# Patient Record
Sex: Male | Born: 1995 | Race: Black or African American | Hispanic: No | Marital: Single | Smoking: Former smoker
Health system: Southern US, Community
[De-identification: ages and names within clinical notes are randomized; demographics above are authoritative.]

## PROBLEM LIST (undated history)

## (undated) DIAGNOSIS — F32A Depression, unspecified: Secondary | ICD-10-CM

## (undated) DIAGNOSIS — F419 Anxiety disorder, unspecified: Secondary | ICD-10-CM

---

## 2020-01-03 ENCOUNTER — Emergency Department (HOSPITAL_COMMUNITY): Payer: Self-pay

## 2020-01-03 ENCOUNTER — Emergency Department (HOSPITAL_COMMUNITY)
Admission: EM | Admit: 2020-01-03 | Discharge: 2020-01-06 | Disposition: A | Discharge status: Home / Self Care | Payer: Self-pay | Attending: Emergency Medicine | Admitting: Emergency Medicine

## 2020-01-03 DIAGNOSIS — X789XXA Intentional self-harm by unspecified sharp object, initial encounter: Secondary | ICD-10-CM | POA: Insufficient documentation

## 2020-01-03 DIAGNOSIS — Z046 Encounter for general psychiatric examination, requested by authority: Secondary | ICD-10-CM

## 2020-01-03 DIAGNOSIS — T1490XA Injury, unspecified, initial encounter: Secondary | ICD-10-CM

## 2020-01-03 DIAGNOSIS — F322 Major depressive disorder, single episode, severe without psychotic features: Secondary | ICD-10-CM | POA: Insufficient documentation

## 2020-01-03 DIAGNOSIS — T1491XA Suicide attempt, initial encounter: Secondary | ICD-10-CM

## 2020-01-03 DIAGNOSIS — Z23 Encounter for immunization: Secondary | ICD-10-CM | POA: Insufficient documentation

## 2020-01-03 DIAGNOSIS — S61512A Laceration without foreign body of left wrist, initial encounter: Secondary | ICD-10-CM

## 2020-01-03 DIAGNOSIS — Z20822 Contact with and (suspected) exposure to covid-19: Secondary | ICD-10-CM | POA: Insufficient documentation

## 2020-01-03 DIAGNOSIS — R259 Unspecified abnormal involuntary movements: Secondary | ICD-10-CM | POA: Insufficient documentation

## 2020-01-03 DIAGNOSIS — S1191XA Laceration without foreign body of unspecified part of neck, initial encounter: Secondary | ICD-10-CM

## 2020-01-03 LAB — CBC
HCT: 47.4 % (ref 39.0–52.0)
Hemoglobin: 15.3 g/dL (ref 13.0–17.0)
MCH: 28.4 pg (ref 26.0–34.0)
MCHC: 32.3 g/dL (ref 30.0–36.0)
MCV: 88.1 fL (ref 80.0–100.0)
Platelets: 227 10*3/uL (ref 150–400)
RBC: 5.38 MIL/uL (ref 4.22–5.81)
RDW: 12.6 % (ref 11.5–15.5)
WBC: 5.6 10*3/uL (ref 4.0–10.5)
nRBC: 0 % (ref 0.0–0.2)

## 2020-01-03 LAB — COMPREHENSIVE METABOLIC PANEL
ALT: 29 U/L (ref 0–44)
AST: 24 U/L (ref 15–41)
Albumin: 5.1 g/dL — ABNORMAL HIGH (ref 3.5–5.0)
Alkaline Phosphatase: 55 U/L (ref 38–126)
Anion gap: 15 (ref 5–15)
BUN: 13 mg/dL (ref 6–20)
CO2: 20 mmol/L — ABNORMAL LOW (ref 22–32)
Calcium: 10 mg/dL (ref 8.9–10.3)
Chloride: 102 mmol/L (ref 98–111)
Creatinine, Ser: 1.2 mg/dL (ref 0.61–1.24)
GFR, Estimated: >60 mL/min (ref >60)
Glucose, Bld: 97 mg/dL (ref 70–99)
Potassium: 3.8 mmol/L (ref 3.5–5.1)
Sodium: 137 mmol/L (ref 135–145)
Total Bilirubin: 2.7 mg/dL — ABNORMAL HIGH (ref 0.3–1.2)
Total Protein: 7.7 g/dL (ref 6.5–8.1)

## 2020-01-03 LAB — RESPIRATORY PANEL BY RT PCR (FLU A&B, COVID)
Influenza A by PCR: NEGATIVE
Influenza B by PCR: NEGATIVE
SARS Coronavirus 2 by RT PCR: NEGATIVE

## 2020-01-03 LAB — I-STAT CHEM 8, ED
BUN: 14 mg/dL (ref 6–20)
Calcium, Ion: 1.19 mmol/L (ref 1.15–1.40)
Chloride: 101 mmol/L (ref 98–111)
Creatinine, Ser: 1 mg/dL (ref 0.61–1.24)
Glucose, Bld: 97 mg/dL (ref 70–99)
HCT: 48 % (ref 39.0–52.0)
Hemoglobin: 16.3 g/dL (ref 13.0–17.0)
Potassium: 3.8 mmol/L (ref 3.5–5.1)
Sodium: 139 mmol/L (ref 135–145)
TCO2: 24 mmol/L (ref 22–32)

## 2020-01-03 LAB — ACETAMINOPHEN LEVEL: Acetaminophen (Tylenol), Serum: <10 ug/mL — ABNORMAL LOW (ref 10–30)

## 2020-01-03 LAB — PROTIME-INR
INR: 1.1 (ref 0.8–1.2)
Prothrombin Time: 13.3 seconds (ref 11.4–15.2)

## 2020-01-03 LAB — LACTIC ACID, PLASMA: Lactic Acid, Venous: 1.7 mmol/L (ref 0.5–1.9)

## 2020-01-03 LAB — SALICYLATE LEVEL: Salicylate Lvl: <7 mg/dL — ABNORMAL LOW (ref 7.0–30.0)

## 2020-01-03 LAB — ETHANOL: Alcohol, Ethyl (B): <10 mg/dL (ref <10)

## 2020-01-03 MED ORDER — BACITRACIN ZINC 500 UNIT/GM EX OINT
TOPICAL_OINTMENT | Freq: Two times a day (BID) | CUTANEOUS | Status: DC
  Filled 2020-01-03: qty 0.9

## 2020-01-03 MED ORDER — LIDOCAINE HCL (PF) 1 % IJ SOLN
INTRAMUSCULAR | Status: AC
  Filled 2020-01-03: qty 30

## 2020-01-03 MED ORDER — LIDOCAINE HCL (PF) 1 % IJ SOLN
30.0000 mL | Freq: Once | INTRAMUSCULAR | Status: AC
  Administered 2020-01-03: 30 mL via INTRADERMAL
  Filled 2020-01-03: qty 30

## 2020-01-03 MED ORDER — TETANUS-DIPHTH-ACELL PERTUSSIS 5-2.5-18.5 LF-MCG/0.5 IM SUSP
0.5000 mL | Freq: Once | INTRAMUSCULAR | Status: AC
  Administered 2020-01-03: 0.5 mL via INTRAMUSCULAR
  Filled 2020-01-03: qty 0.5

## 2020-01-03 MED ORDER — IOHEXOL 350 MG/ML SOLN
75.0000 mL | Freq: Once | INTRAVENOUS | Status: AC | PRN
  Administered 2020-01-03: 75 mL via INTRAVENOUS

## 2020-01-03 NOTE — ED Notes (Signed)
Copies of IVC paperwork made and faxed to [facility]

## 2020-01-03 NOTE — ED Notes (Signed)
Telepsych at bedside at this time.  Pt's mother has been calling requesting to know if pt is here.  Pt does not wish for her to know he is here and does not want any information given to here.

## 2020-01-03 NOTE — Progress Notes (Signed)
Orthopedic Tech Progress Note Patient Details:  McBee Jj Doe 03/20/1873 [id number] Level 2 Trauma, not needed at the moment. Patient ID: Laureen Abrahams Doe, male   DOB: 03/20/1873, 24 y.o.   MRN: [id number]   Lovett Calender 01/03/2020, 12:41 PM
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
## 2020-01-03 NOTE — ED Provider Notes (Signed)
..  Laceration Repair  Date/Time: 01/03/2020 4:58 PM Performed by: Dietrich Pates, PA-C Authorized by: Dietrich Pates, PA-C   Consent:    Consent obtained:  Verbal   Consent given by:  Patient   Risks discussed:  Infection, need for additional repair, nerve damage, pain, poor cosmetic result, poor wound healing, retained foreign body, tendon damage and vascular damage   Alternatives discussed:  No treatment Anesthesia (see MAR for exact dosages):    Anesthesia method:  Local infiltration   Local anesthetic:  Lidocaine 1% w/o epi Laceration details:    Location:  Neck   Neck location:  R anterior   Length (cm):  5 Repair type:    Repair type:  Simple Pre-procedure details:    Preparation:  Patient was prepped and draped in usual sterile fashion Exploration:    Wound exploration: wound explored through full range of motion     Contaminated: no   Treatment:    Area cleansed with:  Saline   Amount of cleaning:  Standard   Irrigation solution:  Sterile saline   Irrigation method:  Pressure wash Skin repair:    Repair method:  Sutures   Suture size:  6-0   Suture material:  Nylon   Suture technique:  Simple interrupted   Number of sutures:  6 Approximation:    Approximation:  Close Post-procedure details:    Dressing:  Open (no dressing)   Patient tolerance of procedure:  Tolerated well, no immediate complications .Marland KitchenLaceration Repair  Date/Time: 01/03/2020 4:59 PM Performed by: Dietrich Pates, PA-C Authorized by: Dietrich Pates, PA-C   Consent:    Consent obtained:  Verbal   Consent given by:  Patient   Risks discussed:  Infection, nerve damage, need for additional repair, pain, poor cosmetic result, poor wound healing, retained foreign body, tendon damage and vascular damage   Alternatives discussed:  No treatment Anesthesia (see MAR for exact dosages):    Anesthesia method:  Local infiltration   Local anesthetic:  Lidocaine 1% w/o epi Laceration details:    Location:  Neck    Neck location:  L anterior   Length (cm):  6 Repair type:    Repair type:  Simple Pre-procedure details:    Preparation:  Patient was prepped and draped in usual sterile fashion Exploration:    Hemostasis achieved with:  Direct pressure   Wound exploration: wound explored through full range of motion   Treatment:    Area cleansed with:  Saline   Amount of cleaning:  Standard   Irrigation solution:  Sterile saline   Irrigation method:  Pressure wash and syringe   Visualized foreign bodies/material removed: no   Skin repair:    Repair method:  Sutures   Suture size:  4-0   Wound skin closure material used: ethilon.   Suture technique:  Simple interrupted   Number of sutures:  8 Approximation:    Approximation:  Close Post-procedure details:    Dressing:  Open (no dressing)   Patient tolerance of procedure:  Tolerated well, no immediate complications       Dietrich Pates, PA-C 01/03/20 1700    Tegeler, Canary Brim, MD 01/03/20 1704

## 2020-01-03 NOTE — ED Provider Notes (Signed)
..  Laceration Repair  Date/Time: 01/03/2020 4:41 PM Performed by: Pricilla Loveless, MD Authorized by: Pricilla Loveless, MD   Anesthesia (see MAR for exact dosages):    Anesthesia method:  Local infiltration   Local anesthetic:  Lidocaine 1% w/o epi Laceration details:    Location:  Shoulder/arm   Shoulder/arm location:  L lower arm   Length (cm):  6 Repair type:    Repair type:  Simple Pre-procedure details:    Preparation:  Imaging obtained to evaluate for foreign bodies and patient was prepped and draped in usual sterile fashion Treatment:    Area cleansed with:  Saline   Amount of cleaning:  Extensive   Irrigation solution:  Sterile saline Skin repair:    Repair method:  Sutures   Suture size:  4-0   Suture material:  Nylon   Suture technique:  Simple interrupted   Number of sutures:  8 Approximation:    Approximation:  Close Post-procedure details:    Dressing:  Antibiotic ointment   Patient tolerance of procedure:  Tolerated well, no immediate complications      Pricilla Loveless, MD 01/03/20 1655

## 2020-01-04 LAB — URINALYSIS, ROUTINE W REFLEX MICROSCOPIC
Bilirubin Urine: NEGATIVE
Glucose, UA: 50 mg/dL — AB
Ketones, ur: 20 mg/dL — AB
Leukocytes,Ua: NEGATIVE
Nitrite: NEGATIVE
Protein, ur: NEGATIVE mg/dL
Specific Gravity, Urine: 1.023 (ref 1.005–1.030)
pH: 5 (ref 5.0–8.0)

## 2020-01-04 LAB — RAPID URINE DRUG SCREEN, HOSP PERFORMED
Amphetamines: NOT DETECTED
Barbiturates: NOT DETECTED
Benzodiazepines: NOT DETECTED
Cocaine: NOT DETECTED
Opiates: NOT DETECTED
Tetrahydrocannabinol: POSITIVE — AB

## 2020-01-04 NOTE — ED Notes (Signed)
Patient continues to rest peacefully on stretcher, sleeping. Sitter remains 1:1 with patient

## 2020-01-04 NOTE — BH Assessment (Cosign Needed)
Tele Assessment Note   Patient Name: Terry Cochran MRN: [id number] Referring Physician: Lynden Oxford, MD Location of Patient: [facility], [phone] Location of Provider: Behavioral Health TTS Department   Re-assessment on 01/04/2020: Patient was seen via tele assessment. He stated he went to a [employer] bathroom and cut himself with a knife in a suicide attempt. He has 3 superficial cuts to his neck and one cut on his LL forearm that required 8 sutures to close. He stated he first thought about hanging himself but did not want his girlfriend to find him like that. Next he went to a bridge and took off his shoes and socks, sat on the ledge but could not jump. He then decided to go to [facility]. 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 He is working 2 jobs and sleeping 2-3 hours per night. He stated he has never attempted suicide before. He minimizes the seriousness of his actions and feels it would be more therapeutic for him to be at home. He denies any history of mental health problems. He denies family history of mental health problems or suicide. He does not believe in taking medication. This Clinical research associate suggested he be started on an antidepressant, he declined. He was calm and cooperative during evaluation. He has a tendency to be grandiose in his thought process and intellectualizes his actions. Given the nature of his suicide attempt along with 3 thought out plans and the impulsivity of his age, it is recommended he be psychiatrically hospitalized for stabilization and medication management. IVC paperwork filled out by [employer]. Patient is aware of his involuntary status.   Tele  Assessment note on 01/03/2020, written by Venda Rodes at 9:30 PM:  Terry Cochran is an 24 y.o. single male who presents unaccompanied to [facility] via EMS after being petitioned for involuntary commitment by a friend, [name] [phone]. Affidavit and petition states: "Respondent was found in the woods about a month ago about to slit his wrists but girlfriend was able to stop him. Girlfriend also stated to friend that he had gone back to the woods to possibly make another attempt. Respondent left a troubling message on phone. Police were called to check on him and found him sitting beside two belt that he had tied together."    Per ED record, Pt was found by EMS after [facility] employees found him actively trying to cut his own neck and left forearm with a knife. Per EDP note, Pt has three lacerations that appear to superficial to the neck on the right lateral, left lateral, and anterior part of the mid neck and also had several superficial and one deeper laceration to the left forearm. Pt initially was not responding to staff. During assessment, Pt states he chose not to respond and was aware of everything that was happening.   Pt is guarded and defensive during assessment. He initially refused to answer any question then he began by asking whether people should be able to pursue their own goals based on their individual perspectives of reality. He then began to question this TTS counselor regarding knowledge of racial injustice in [place] and minority  perspectives. He states he is experiencing multiple stressors but refuses to discuss what prompted him to harm himself today. He reports working 2 jobs, that he is on his feet 12 hours per day, and that he sleeps 1-2 hours per night. He says he can go for days only drinking water. He would not answer whether he had attempted suicide before. He denies homicidal ideation or history of violence. He denies any history of experiencing auditory or  visual hallucinations. He denies alcohol or other substance use-- blood alcohol level is negative and urine drug screen is in process.   Pt reports he does not live alone but would not give further details. During assessment Pt's mother called and Pt said she does not live locally, he has not spoken to her in a year, and he would not give permission for her to have any information. Pt will not give permission to contact anyone for collateral information. He denies having a psychiatrist or therapist. He denies history of inpatient psychiatric treatment. He denies legal problems. He denies access to firearms.  TTS chose to honor Pt's request for confidentiality at this time by not contacting petitioner, as additional information is not needed at this time to determine recommendation for Pt's level of care.  Pt has no shirt and is covered by a blanket. He is alert and oriented x4. Pt speaks in a clear tone, at moderate volume and normal pace. Motor behavior appears normal. Eye contact is good. Pt's mood is angry and irritable, affect is congruent with mood. Thought process is coherent. There is no indication Pt is currently responding to internal stimuli. Pt says he wants to go home and indicates he would like some shoes so he can leave.   Diagnosis: F32.2 Major depressive disorder, Single episode, Severe  Past Medical History: No past medical history on file.    Family History: No family history on file.  Social History:  has no history on file for tobacco use, alcohol use, and drug use.  Additional Social History:  Alcohol / Drug Use Pain Medications: Denies use Prescriptions: Denies use Over the Counter: Denies use History of alcohol / drug use?: No history of alcohol / drug abuse Longest period of sobriety (when/how long): NA  CIWA: CIWA-Ar BP: 117/77 Pulse Rate: 74 COWS:    Allergies: Not on File  Home Medications: (Not in a hospital admission)   OB/GYN Status:  No LMP for male  patient.  General Assessment Data Location of Assessment: [facility] ED TTS Assessment: In system Is this a Tele or Face-to-Face Assessment?: Tele Assessment Is this an Initial Assessment or a Re-assessment for this encounter?: Initial Assessment Patient Accompanied by:: N/A Language Other than English: No Living Arrangements: Other (Comment) (Lives with other people) What gender do you identify as?: Male Date Telepsych consult ordered in [facility]: 01/03/20 Time Telepsych consult ordered in [facility]: 1523 Marital status: Single Maiden name: NA Pregnancy Status: No Living Arrangements: Non-relatives/Friends Can pt return to current living arrangement?: Yes Admission Status: Involuntary Petitioner: Police Is patient capable of signing voluntary admission?: Yes Referral Source: Other Mudlogger) Insurance type: Self-pay     Crisis Care Plan Living Arrangements: Non-relatives/Friends Legal Guardian: Other: (Self) Name of Psychiatrist: None Name of Therapist: None  Education Status Is patient currently in school?: No Is the patient employed, unemployed or receiving disability?: Employed  Risk to self with the past 6 months Suicidal Ideation: Yes-Currently Present Has patient been a risk to self within the past 6 months prior  to admission? : Yes Suicidal Intent: Yes-Currently Present Has patient had any suicidal intent within the past 6 months prior to admission? : Yes Is patient at risk for suicide?: Yes Suicidal Plan?: Yes-Currently Present Has patient had any suicidal plan within the past 6 months prior to admission? : Yes Specify Current Suicidal Plan: Pt cut his throat and wrist in suicide attempt Access to Means: Yes Specify Access to Suicidal Means: Pt cut his throat and wrist What has been your use of drugs/alcohol within the last 12 months?: Pt denies use Previous Attempts/Gestures:  (Pt refused to answer) How many times?:  (Pt refused to answer) Other Self Harm Risks: Pt  refused to answer Triggers for Past Attempts: Other (Comment) (Pt refused to answer) Intentional Self Injurious Behavior: None Family Suicide History: Unknown Recent stressful life event(s): Other (Comment) (Pt refused to answer) Persecutory voices/beliefs?: No Depression: Yes Depression Symptoms: Feeling angry/irritable, Insomnia Substance abuse history and/or treatment for substance abuse?: No Suicide prevention information given to non-admitted patients: Not applicable  Risk to Others within the past 6 months Homicidal Ideation: No Does patient have any lifetime risk of violence toward others beyond the six months prior to admission? : No Thoughts of Harm to Others: No Current Homicidal Intent: No Current Homicidal Plan: No Access to Homicidal Means: No Identified Victim: None History of harm to others?: No Assessment of Violence: None Noted Violent Behavior Description: Pt denies history of violence Does patient have access to weapons?: No Criminal Charges Pending?: No Does patient have a court date: No Is patient on probation?: No  Psychosis Hallucinations: None noted Delusions: None noted  Mental Status Report Appearance/Hygiene: Other (Comment) (Shirtless, covered by blanket) Eye Contact: Good Motor Activity: Freedom of movement, Unremarkable Speech: Logical/coherent Level of Consciousness: Alert, Irritable Mood: Angry, Irritable Affect: Angry Anxiety Level: Minimal Thought Processes: Coherent Judgement: Impaired Orientation: Person, Place, Time, Situation Obsessive Compulsive Thoughts/Behaviors: None  Cognitive Functioning Concentration: Normal Memory: Recent Intact, Remote Intact Is patient IDD: No Insight: Fair Impulse Control: Fair Appetite: Poor Have you had any weight changes? : No Change Sleep: Decreased Total Hours of Sleep: 2 Vegetative Symptoms: None  ADLScreening [facility] Assessment Services) Patient's cognitive ability adequate to safely  complete daily activities?: Yes Patient able to express need for assistance with ADLs?: Yes Independently performs ADLs?: Yes (appropriate for developmental age)  Prior Inpatient Therapy Prior Inpatient Therapy: No  Prior Outpatient Therapy Prior Outpatient Therapy: No Does patient have an ACCT team?: No Does patient have Intensive In-House Services?  : No Does patient have [facility] services? : No Does patient have P4CC services?: No  ADL Screening (condition at time of admission) Patient's cognitive ability adequate to safely complete daily activities?: Yes Is the patient deaf or have difficulty hearing?: No Does the patient have difficulty seeing, even when wearing glasses/contacts?: No Does the patient have difficulty concentrating, remembering, or making decisions?: No Patient able to express need for assistance with ADLs?: Yes Does the patient have difficulty dressing or bathing?: No Independently performs ADLs?: Yes (appropriate for developmental age) Does the patient have difficulty walking or climbing stairs?: No Weakness of Legs: None Weakness of Arms/Hands: None  Home Assistive Devices/Equipment Home Assistive Devices/Equipment: None    Abuse/Neglect Assessment (Assessment to be complete while patient is alone) Abuse/Neglect Assessment Can Be Completed: Yes Physical Abuse:  (Pt refused to answer) Verbal Abuse:  (Pt refused to answer) Sexual Abuse:  (Pt refused to answer) Exploitation of patient/patient's resources:  (Pt refused to answer) Self-Neglect:  (Pt  refused to answer)     Advance Directives (For Healthcare) Does Patient Have a Medical Advance Directive?: No Would patient like information on creating a medical advance directive?: No - Patient declined          Disposition: Inpatient psychiatric hospitalization recommended. Heather, LCSW at [facility] advised.    This service was provided via telemedicine using a 2-way, interactive audio and video  technology.  Names of all persons participating in this telemedicine service and their role in this encounter. Name: Terry Cochran CNOBS Role: Patient  Name: Elta Guadeloupe Role: PMHNP-BC          Laveda Abbe 01/04/2020 3:58 PM

## 2020-01-04 NOTE — ED Notes (Signed)
Snack was placed on patient bedside table.

## 2020-01-04 NOTE — ED Notes (Signed)
Lunch Tray Ordered @ 1042. 

## 2020-01-04 NOTE — ED Notes (Signed)
PT eating dinner at this time.

## 2020-01-04 NOTE — Progress Notes (Deleted)
Patient ID: Terry Cochran, male   DOB: March 30, 1995, 24 y.o.   MRN: [id number]

## 2020-01-04 NOTE — ED Notes (Signed)
Diet ordered for Breakfast. 

## 2020-01-04 NOTE — ED Notes (Signed)
Patient resting peacefully on stretcher. Sitter at bedside. NAD noted.

## 2020-01-04 NOTE — ED Notes (Signed)
Patient continues to rest peacefully on stretcher. Respirations appear even and nonlabored. Sitter remains 1:1 with patient

## 2020-01-05 NOTE — BH Assessment (Signed)
[facility] Assessment Progress Note Patient was seen this date by this writer to assess current mental health status. Patient presents with a depressed affect and continues to voice thoughts of self harm. Case was staffed with Maisie Fus NP who recommended a current inpatient admission to assist with stabilization.

## 2020-01-05 NOTE — ED Notes (Signed)
Ordered breakfast 

## 2020-01-05 NOTE — ED Notes (Signed)
Dinner tray ordered.

## 2020-01-05 NOTE — ED Notes (Signed)
Pt calm, pleasant, and cooperative. Pt allowed RN to obtain VS and permitted application of bacitracin refused earlier.

## 2020-01-05 NOTE — Progress Notes (Signed)
Pt has been accepted to [facility] Room 302-02 to the service of MD Clary.  She may arrive after 1000 hours and report may be called to [phone] when transportation has been arranged.

## 2020-01-06 ENCOUNTER — Inpatient Hospital Stay (HOSPITAL_COMMUNITY)
Admission: AD | Admit: 2020-01-06 | Discharge: 2020-01-11 | DRG: 885 | Disposition: A | Discharge status: Home / Self Care | Payer: Federal, State, Local not specified - Other | Attending: Psychiatry | Admitting: Psychiatry

## 2020-01-06 ENCOUNTER — Other Ambulatory Visit: Payer: Self-pay

## 2020-01-06 ENCOUNTER — Encounter (HOSPITAL_COMMUNITY): Payer: Self-pay | Admitting: Psychiatry

## 2020-01-06 DIAGNOSIS — F17210 Nicotine dependence, cigarettes, uncomplicated: Secondary | ICD-10-CM | POA: Diagnosis present

## 2020-01-06 DIAGNOSIS — F322 Major depressive disorder, single episode, severe without psychotic features: Principal | ICD-10-CM | POA: Diagnosis present

## 2020-01-06 DIAGNOSIS — K5900 Constipation, unspecified: Secondary | ICD-10-CM | POA: Diagnosis present

## 2020-01-06 DIAGNOSIS — F419 Anxiety disorder, unspecified: Secondary | ICD-10-CM | POA: Diagnosis present

## 2020-01-06 DIAGNOSIS — Z9151 Personal history of suicidal behavior: Secondary | ICD-10-CM | POA: Diagnosis not present

## 2020-01-06 HISTORY — DX: Depression, unspecified: F32.A

## 2020-01-06 HISTORY — DX: Anxiety disorder, unspecified: F41.9

## 2020-01-06 MED ORDER — MAGNESIUM HYDROXIDE 400 MG/5ML PO SUSP: 30.0000 mL | Freq: Every day | ORAL | Status: DC | PRN

## 2020-01-06 MED ORDER — BACITRACIN ZINC 500 UNIT/GM EX OINT
TOPICAL_OINTMENT | Freq: Two times a day (BID) | CUTANEOUS | Status: DC
  Administered 2020-01-06 – 2020-01-11 (×9): 31.5556 via TOPICAL
  Filled 2020-01-06 (×2): qty 28.35

## 2020-01-06 MED ORDER — HYDROXYZINE HCL 25 MG PO TABS
25.0000 mg | ORAL_TABLET | Freq: Three times a day (TID) | ORAL | Status: DC | PRN
  Filled 2020-01-06 (×2): qty 1
  Filled 2020-01-06: qty 10
  Filled 2020-01-06: qty 1

## 2020-01-06 MED ORDER — TRAZODONE HCL 50 MG PO TABS
50.0000 mg | ORAL_TABLET | Freq: Every evening | ORAL | Status: DC | PRN
  Filled 2020-01-06 (×2): qty 1

## 2020-01-06 MED ORDER — ACETAMINOPHEN 325 MG PO TABS: 650.0000 mg | ORAL_TABLET | Freq: Four times a day (QID) | ORAL | Status: DC | PRN

## 2020-01-06 MED ORDER — ALUM & MAG HYDROXIDE-SIMETH 200-200-20 MG/5ML PO SUSP: 30.0000 mL | ORAL | Status: DC | PRN

## 2020-01-06 NOTE — ED Notes (Signed)
[facility] called to transport pt to [facility] 

## 2020-01-06 NOTE — Tx Team (Signed)
Initial Treatment Plan 01/06/2020 6:51 PM Terry Cochran HXT:[id number]    PATIENT STRESSORS: Financial difficulties Medication change or noncompliance Occupational concerns Substance abuse Traumatic event   PATIENT STRENGTHS: Ability for insight Average or above average intelligence Capable of independent living Communication skills Motivation for treatment/growth Physical Health Supportive family/friends Work skills   PATIENT IDENTIFIED PROBLEMS: "suicidal feelings"  "anxiety"  "depression"                 DISCHARGE CRITERIA:  Ability to meet basic life and health needs Adequate post-discharge living arrangements Improved stabilization in mood, thinking, and/or behavior Medical problems require only outpatient monitoring Motivation to continue treatment in a less acute level of care Need for constant or close observation no longer present Reduction of life-threatening or endangering symptoms to within safe limits Safe-care adequate arrangements made Verbal commitment to aftercare and medication compliance Withdrawal symptoms are absent or subacute and managed without 24-hour nursing intervention  PRELIMINARY DISCHARGE PLAN: Attend aftercare/continuing care group Attend PHP/IOP Outpatient therapy Return to previous living arrangement Return to previous work or school arrangements  PATIENT/FAMILY INVOLVEMENT: This treatment plan has been presented to and reviewed with the patient, Terry Cochran..  The patient and family have been given the opportunity to ask questions and make suggestions.  Quintella Reichert [place], RN 01/06/2020, 6:51 PM

## 2020-01-06 NOTE — ED Notes (Signed)
Pt given lunch tray.

## 2020-01-06 NOTE — Progress Notes (Signed)
Pt reports having used a box cutter to cut his neck and his left arm. He has 21 stitches around his neck and 9 to his left arm which are intact. No redness, swelling, or signs of infection have been observed. He said that his trigger was "a lot of build up of rough life issues and responsibilities." He shares that he lost 3 family members recently and that his girlfriend had been in a car accident so he was taking care of her. He said that he works 2 jobs. He walks to both workplaces and he takes him about 45 minutes. He heads out in the morning at 04:15 am and works at [employer] until 4 pm. Then he has an hour break before he has to be at [employer] at 5 pm until 1030 pm. Shella Maxim that his family is really dependent on him financially. His parents are going through a "nasty" divorce and then he has 4 younger sisters that he is worried about since they are split up. One of them is asking for him to help pay for surgery to get her deviated nasal septum fixed. One of them stays in a group home in [place], the baby sister is 2 years olds and she got Covid. Lastly, his oldest sister is in an abusive relationship in [place] and he doesn't know how he can help her staying so far away. All of these stressors have piled up on him. He regrets trying to commit suicide and said that it was selfish of him. Pt denies SI/HI and AVH. Active listening, reassurance, and support provided. Encouraged pt to be more active in the milieu and attend groups. Q 15 min safety checks initiated. Pt's safety has been maintained.   01/06/20 2111  Psych Admission Type (Psych Patients Only)  Admission Status Involuntary  Psychosocial Assessment  Patient Complaints Anxiety;Depression;Sadness;Worrying  Eye Contact Fair  Facial Expression Anxious  Affect Anxious;Sad;Depressed  Speech Logical/coherent  Interaction Assertive  Motor Activity Other (Comment) (WDL)  Appearance/Hygiene In scrubs  Behavior Characteristics Cooperative;Anxious;Calm  Mood  Depressed;Anxious;Sad  Thought Process  Coherency WDL  Content Blaming self  Delusions None reported or observed  Perception WDL  Hallucination None reported or observed  Judgment Poor  Confusion None  Danger to Self  Current suicidal ideation? Denies  Danger to Others  Danger to Others None reported or observed

## 2020-01-06 NOTE — Progress Notes (Signed)
   01/06/20 2111  COVID-19 Daily Checkoff  Have you had a fever (temp > 37.80C/100F)  in the past 24 hours?  No  COVID-19 EXPOSURE  Have you traveled outside the state in the past 14 days? No  Have you been in contact with someone with a confirmed diagnosis of COVID-19 or PUI in the past 14 days without wearing appropriate PPE? No  Have you been living in the same home as a person with confirmed diagnosis of COVID-19 or a PUI (household contact)? No  Have you been diagnosed with COVID-19? No

## 2020-01-06 NOTE — ED Notes (Signed)
Lunch Tray Ordered 

## 2020-01-06 NOTE — Progress Notes (Signed)
Patient is 24 yrs old, male, involuntary.  Patient works two jobs, paid for car in girlfriend's name, first admission to [facility].  Patient would not discuss stressors.  Stated he drinks 1-2 times month, tequilla or wine, since 24 yrs old.  Uses vap E-cig, last used 5 days ago since 24 yrs old.  THC and sample of CBD, very little.  Denied heroin and cocaine use.  Drinks a lot of water.  Tattoos "B" geside L ear, left lower arm tattoo.  21 stitches in neck, left/right neck and middle of neck.  9 stitchs on L lower arm.  Tattoos on R lower leg and and L neck.  Denied surgeries, never married, no children.  He and his girlfriend live together in [place].  Walks to work, lives very close to work, few blacks away.  Denied anxiety and hopeless, rated depression   Denied SI at this time, denied HI.  Denied A/V hallucinations.  Stated he has 6-7 yrs college.   Fall risk information given to patient, low fall risk. Patient oriented to ujnit, given food/drink.

## 2020-01-06 NOTE — Progress Notes (Signed)
Recreation Therapy Notes  Animal-Assisted Activity (AAA) Program Checklist/Progress Notes Patient Eligibility Criteria Checklist & Daily Group note for Rec Tx Intervention  Date: 10.19.21 Time: 1430 Location: [address]   AAA/T Program Assumption of Risk Form signed by Engineer, production or Parent Legal Guardian  YES  Patient is free of allergies or sever asthma  YES  Patient reports no fear of animals  YES  Patient reports no history of cruelty to animals YES  Patient understands his/her participation is voluntary  YES  Patient washes hands before animal contact  YES   Patient washes hands after animal contact  YES    Education: Charity fundraiser, Appropriate Animal Interaction   Education Outcome: Acknowledges understanding/In group clarification offered/Needs additional education.  Clinical Observations/Feedback: Pt did not attend.    Caroll Rancher, LRT/CTRS         Caroll Rancher A 01/06/2020 3:34 PM

## 2020-01-06 NOTE — Progress Notes (Signed)
Pt accepted to [facility], bed  302-2  Dorise Hiss, NP is the accepting provider.    Dr. Jola Babinski is the attending provider.    Call report to [phone]    Alana @ [facility] ED notified.     Pt is under IVC and will be transported by law enforcement  Pt may arrive to [facility] as soon as transportation is arranged.    Wells Guiles, MSW, LCSW, LCAS Clinical Social Worker II Disposition CSW [phone]

## 2020-01-07 LAB — LIPID PANEL
Cholesterol: 151 mg/dL (ref 0–200)
HDL: 54 mg/dL (ref >40)
LDL Cholesterol: 90 mg/dL (ref 0–99)
Total CHOL/HDL Ratio: 2.8 RATIO
Triglycerides: 37 mg/dL (ref <150)
VLDL: 7 mg/dL (ref 0–40)

## 2020-01-07 LAB — TSH: TSH: 3.796 u[IU]/mL (ref 0.350–4.500)

## 2020-01-07 MED ORDER — ENSURE ENLIVE PO LIQD
237.0000 mL | ORAL | Status: DC
  Administered 2020-01-07 – 2020-01-10 (×3): 237 mL via ORAL

## 2020-01-07 NOTE — Progress Notes (Signed)
[facility] Group Notes:  (Nursing/MHT/Case Management/Adjunct)  Date:  01/07/2020  Time:  2030  Type of Therapy:  wrap up group  Participation Level:  Active  Participation Quality:  Appropriate, Attentive, Sharing and Supportive  Affect:  Appropriate  Cognitive:  Appropriate  Insight:  Improving  Engagement in Group:  Engaged  Modes of Intervention:  Clarification, Education and Support  Summary of Progress/Problems: Positive thinking and positive change were discussed.   Marcille Buffy 01/07/2020, 9:19 PM

## 2020-01-07 NOTE — [facility] Group Notes (Signed)
[facility] LCSW Group Therapy  01/07/2020 2:27 PM  Type of Therapy:  Coping Skills Outside   Participation Level:  Active  Participation Quality:  Appropriate  Affect:  Appropriate  Cognitive:  Appropriate  Insight:  Developing/Improving  Engagement in Therapy:  Developing/Improving  Modes of Intervention:  Activity  Summary of Progress/Problems: Terry Cochran states that he likes to practice using patience for coping.  Terry Cochran played basketball to help with coping skills and talked to nursing staff. Terry Cochran participated in group and shared openly.    Terry Cochran 01/07/2020, 2:27 PM
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
## 2020-01-07 NOTE — Progress Notes (Signed)
D:  Patient's self inventory sheet, patient  has fair sleep, no sleep medication.  Fair appetite, high energy level, good concentration.  Denied depression, hopeless and anxiety.  Denied withdrawals.  Denied SI.  Denied physical problems.  Denied physical pain.  Goal is being thankful.  Plans to appreciate.  No discharge plans.   A:  Medications administered per MD orders.  Emotional support and encouragement given patient. R:  Denied SI and HI, contracts for safety.  Denied A/V hallucinations.  Safety maintained with 15 minute checks.

## 2020-01-07 NOTE — Tx Team (Cosign Needed)
Interdisciplinary Treatment and Diagnostic Plan Update  01/07/2020 Time of Session: 9:53AM Terry Cochran MRN: [id number]  Principal Diagnosis: <principal problem not specified>  Secondary Diagnoses: Active Problems:   Severe major depression, single episode (HCC)   Current Medications:  Current Facility-Administered Medications  Medication Dose Route Frequency Provider Last Rate Last Admin  . acetaminophen (TYLENOL) tablet 650 mg  650 mg Oral Q6H PRN Lindon Romp A, NP      . alum & mag hydroxide-simeth (MAALOX/MYLANTA) 200-200-20 MG/5ML suspension 30 mL  30 mL Oral Q4H PRN Rozetta Nunnery, NP      . bacitracin ointment   Topical BID Rozetta Nunnery, NP   [phone] application at 38/17/71 0900  . feeding supplement (ENSURE ENLIVE / ENSURE PLUS) liquid 237 mL  237 mL Oral Q24H Sharma Covert, MD   237 mL at 01/07/20 0900  . hydrOXYzine (ATARAX/VISTARIL) tablet 25 mg  25 mg Oral TID PRN Lindon Romp A, NP      . magnesium hydroxide (MILK OF MAGNESIA) suspension 30 mL  30 mL Oral Daily PRN Lindon Romp A, NP      . traZODone (DESYREL) tablet 50 mg  50 mg Oral QHS PRN Lindon Romp A, NP       PTA Medications: No medications prior to admission.    Patient Stressors: Financial difficulties Medication change or noncompliance Occupational concerns Substance abuse Traumatic event  Patient Strengths: Ability for insight Average or above average intelligence Capable of independent living Communication skills Motivation for treatment/growth Physical Health Supportive family/friends Work skills  Treatment Modalities: Medication Management, Group therapy, Case management,  1 to 1 session with clinician, Psychoeducation, Recreational therapy.   Physician Treatment Plan for Primary Diagnosis: <principal problem not specified> Long Term Goal(s):     Short Term Goals:    Medication Management: Evaluate patient's response, side effects, and tolerance of medication  regimen.  Therapeutic Interventions: 1 to 1 sessions, Unit Group sessions and Medication administration.  Evaluation of Outcomes: Not Met  Physician Treatment Plan for Secondary Diagnosis: Active Problems:   Severe major depression, single episode ([place])  Long Term Goal(s):     Short Term Goals:       Medication Management: Evaluate patient's response, side effects, and tolerance of medication regimen.  Therapeutic Interventions: 1 to 1 sessions, Unit Group sessions and Medication administration.  Evaluation of Outcomes: Not Met   RN Treatment Plan for Primary Diagnosis: <principal problem not specified> Long Term Goal(s): Knowledge of disease and therapeutic regimen to maintain health will improve  Short Term Goals: Ability to verbalize feelings will improve, Ability to disclose and discuss suicidal ideas, Ability to identify and develop effective coping behaviors will improve and Compliance with prescribed medications will improve  Medication Management: RN will administer medications as ordered by provider, will assess and evaluate patient's response and provide education to patient for prescribed medication. RN will report any adverse and/or side effects to prescribing provider.  Therapeutic Interventions: 1 on 1 counseling sessions, Psychoeducation, Medication administration, Evaluate responses to treatment, Monitor vital signs and CBGs as ordered, Perform/monitor CIWA, COWS, AIMS and Fall Risk screenings as ordered, Perform wound care treatments as ordered.  Evaluation of Outcomes: Not Met   LCSW Treatment Plan for Primary Diagnosis: <principal problem not specified> Long Term Goal(s): Safe transition to appropriate next level of care at discharge, Engage patient in therapeutic group addressing interpersonal concerns.  Short Term Goals: Engage patient in aftercare planning with referrals and resources, Increase emotional regulation, Facilitate patient progression  through  stages of change regarding substance use diagnoses and concerns and Increase skills for wellness and recovery  Therapeutic Interventions: Assess for all discharge needs, 1 to 1 time with Social worker, Explore available resources and support systems, Assess for adequacy in community support network, Educate family and significant other(s) on suicide prevention, Complete Psychosocial Assessment, Interpersonal group therapy.  Evaluation of Outcomes: Not Met   Progress in Treatment: Attending groups: No. and As evidenced by:  Pt recently admitted. Participating in groups: No. Taking medication as prescribed: Yes. Toleration medication: Yes. Family/Significant other contact made: No, will contact:  SW will continue to assess. Patient understands diagnosis: Yes. Discussing patient identified problems/goals with staff: Yes. Medical problems stabilized or resolved: Yes. Denies suicidal/homicidal ideation: Yes. Issues/concerns per patient self-inventory: No. Other: None  New problem(s) identified: Yes, Describe:  Pt addmited for recent suicide attempt via cutting. Pt has history of prior suicide attemtps.  New Short Term/Long Term Goal(s): Pt encouraged to attend groups to learn new and transferable coping skill. SW will ensure Pt has referrals for appropriate follow-up services prior to discharge.  Patient Goals:  "To prepare to be productive citizen again"  Discharge Plan or Barriers: SW will continue to assess.  Reason for Continuation of Hospitalization: Anxiety Depression Medication stabilization  Estimated Length of Stay: 3-5 Days  Attendees: Patient: Terry Cochran 01/07/2020 10:53 AM  Physician: Harriett Sine, NP 01/07/2020 10:53 AM  Nursing:  01/07/2020 10:53 AM  RN Care Manager: 01/07/2020 10:53 AM  Social Worker: Freddi Che, LCSW 01/07/2020 10:53 AM  Recreational Therapist:  01/07/2020 10:53 AM  Other:  01/07/2020 10:53 AM  Other:  01/07/2020 10:53 AM  Other: 01/07/2020  10:53 AM    Scribe for Treatment Team: Freddi Che, LCSW 01/07/2020 10:53 AM

## 2020-01-07 NOTE — Progress Notes (Signed)
NUTRITION ASSESSMENT  Pt identified as at risk on the Malnutrition Screen Tool  INTERVENTION: 1. Supplements: Ensure Enlive po daily, each supplement provides 350 kcal and 20 grams of protein   NUTRITION DIAGNOSIS: Unintentional weight loss related to sub-optimal intake as evidenced by pt report.   Goal: Pt to meet >/= 90% of their estimated nutrition needs.  Monitor:  PO intake  Assessment:  Pt admitted for suicide attempt and depression. Pt reports poor appetite. Denies weight changes. Weight records show 7 lbs of weight loss but time frame in question. Will order Ensure supplement daily.    Height: Ht Readings from Last 1 Encounters:  01/06/20 5\' 9"  (1.753 m)    Weight: Wt Readings from Last 1 Encounters:  01/06/20 59.9 kg    Weight Hx: Wt Readings from Last 10 Encounters:  01/06/20 59.9 kg  01/03/20 63.5 kg    BMI:  Body mass index is 19.49 kg/m. Pt meets criteria for normal based on current BMI.  Estimated Nutritional Needs: Kcal: 25-30 kcal/kg Protein: > 1 gram protein/kg Fluid: 1 ml/kcal  Diet Order:  Diet Order            Diet regular Room service appropriate? Yes; Fluid consistency: Thin  Diet effective now                Pt is also offered choice of unit snacks mid-morning and mid-afternoon.  Pt is eating as desired.   Lab results and medications reviewed.   01/05/20, MS, RD, LDN Inpatient Clinical Dietitian Contact information available via Amion
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
## 2020-01-07 NOTE — Progress Notes (Signed)
   01/07/20 2312  Psych Admission Type (Psych Patients Only)  Admission Status Involuntary  Psychosocial Assessment  Patient Complaints None  Eye Contact Fair  Facial Expression Anxious  Affect Anxious;Sad;Depressed  Speech Logical/coherent  Interaction Assertive  Motor Activity Other (Comment) (WDL)  Appearance/Hygiene In scrubs  Behavior Characteristics Cooperative  Mood Depressed  Thought Process  Coherency WDL  Content Blaming self  Delusions None reported or observed  Perception WDL  Hallucination None reported or observed  Judgment Poor  Confusion None  Danger to Self  Current suicidal ideation? Denies  Danger to Others  Danger to Others None reported or observed  D: Patient in dayroom interacting well with peers. Pt appears to minimize the situation that brought him here. Pt reported after he heals "maybe the girls will find me though".  A: Support and encouragement provided as needed.  R: Patient remains safe on the unit. Will continue to monitor for safety and stability.

## 2020-01-07 NOTE — Plan of Care (Signed)
Nurse discussed anxiety, depression and coping skills with patient.  

## 2020-01-08 LAB — HEMOGLOBIN A1C
Hgb A1c MFr Bld: 5.7 % — ABNORMAL HIGH (ref 4.8–5.6)
Mean Plasma Glucose: 117 mg/dL

## 2020-01-08 NOTE — Progress Notes (Signed)
Patient ID: Terry Cochran, male   DOB: [birth year], 23 y.o.   MRN: [id number] D: Patient calm and cooperative, denies SI/HI/AVH, visible on the unit interacting with others, reports that sleep quality last night was fair, appetite in last 24hrs has been good, reports a high energy level, reports good concentration, and denies being in any physical pain. Pt reports that his goal today is "insight and consideration of others", and reports that he intends to meet this goal by "listen, and be friendly", and denies having any concerns, and verbally contracts for safety on the unit.  A: Q15 minute checks being maintained for safety.  R: Will continue to maintain on Q15 minute safety checks [place] NOVEL CORONAVIRUS (COVID-19) DAILY CHECK-OFF SYMPTOMS - answer yes or no to each - every day NO YES  Have you had a fever in the past 24 hours?  . Fever (Temp > 37.80C / 100F) X   Have you had any of these symptoms in the past 24 hours? . New Cough .  Sore Throat  .  Shortness of Breath .  Difficulty Breathing .  Unexplained Body Aches   X   Have you had any one of these symptoms in the past 24 hours not related to allergies?   . Runny Nose .  Nasal Congestion .  Sneezing   X   If you have had runny nose, nasal congestion, sneezing in the past 24 hours, has it worsened?  X   EXPOSURES - check yes or no X   Have you traveled outside the state in the past 14 days?  X   Have you been in contact with someone with a confirmed diagnosis of COVID-19 or PUI in the past 14 days without wearing appropriate PPE?  X   Have you been living in the same home as a person with confirmed diagnosis of COVID-19 or a PUI (household contact)?    X   Have you been diagnosed with COVID-19?    X              What to do next: Answered NO to all: Answered YES to anything:   Proceed with unit schedule Follow the [facility] Inpatient Flowsheet.

## 2020-01-08 NOTE — BHH Group Notes (Signed)
Occupational Therapy Group Note Date: 01/08/2020 Group Topic/Focus: Communication Skills  Group Description: Group encouraged increased engagement and participation through discussion focused on identifying and 'breaking down barriers." Group members were encouraged to complete a worksheet and engage in discussion identifying barriers that get in the way of seeking treatment, asking for help, and managing our mental health.  Participation Level: Active   Participation Quality: Independent   Behavior: Cooperative and Interactive   Speech/Thought Process: Focused   Affect/Mood: Euthymic   Insight: Moderate   Judgement: Moderate   Individualization: Terry Cochran was active and independent in his participation of discussion/activity, sharing his biggest barriers as "nondisclosure" and "apathy". Pt shared further and spoke about difficulty opening up to others, trust, and receiving appropriate support/resources. He shared that it is difficult to "share your story with strangers and share it multiple times". Pt spoke openly throughout group with peers and expressed value of group therapy and connecting with others - pt encouraged to follow up and look into therapy resources at the OP level; appreciative and receptive.   Modes of Intervention: Activity, Discussion, Education, Socialization and Support  Patient Response to Interventions:  Attentive, Engaged, Receptive and Interested   Plan: Continue to engage patient in OT groups 2 - 3x/week.  01/08/2020  Terry Cochran, MOT, OTR/L

## 2020-01-08 NOTE — Progress Notes (Signed)
[facility] MD Progress Note  01/08/2020 5:28 PM Kalon Erhardt  MRN:  [id number] Subjective:  "I'm good"  Chart reviewed. NAEON. Patient interviewed in the office. He is calm and cooperative throughout although makes little-no eye contact, bizarre affect and occasional inappropriate smiling. Pt states that his day was ok but is looking forward to discharge. He denies SI/HI/AVH.  When discussing his actions prior to admission, he smiles inappropriately and affect is not appropriate for situation. He indicates that he survived because he "has purpose".  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 Patient provided consent to call his GF, Dondra Prader.   Dondra Prader (GF) [phone] Lot going on stressors from family and relationship prior to hospitalization. His actions were precipitated because they had an argument after  said he had a dream about an ex. Has not noticed any strange or out of character behavior. He does not seem to be distracted, has never seen him talk to himself or make bizarre statements.  He graduated hs in 2017 but was doing a dual program with [employer] for a robotics program and did get a certifiate. 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 He was going to college in [place] and did obtain certificates. They spoke recently , thought he sounded back to normal. Expressed that he would benefit from counseling especially with numerous family stressors.    Principal Problem: Severe major depression, single episode (HCC) Diagnosis: Principal Problem:   Severe major depression, single episode (HCC)  Total Time spent with patient: 20 minutes  Past Psychiatric History: see H&P  Past Medical History:  Past Medical History:  Diagnosis Date  . Anxiety   . Depression    History reviewed. No  pertinent surgical history. Family History: History reviewed. No pertinent family history. Family Psychiatric  History: see H&P Social History:  Social History   Substance and Sexual Activity  Alcohol Use Yes  . Alcohol/week: 1.0 standard drink  . Types: 1 Glasses of wine per week   Comment: 1-2 times a month     Social History   Substance and Sexual Activity  Drug Use Yes  . Frequency: 1.0 times per week  . Types: Marijuana   Comment: CBD once    Social History   Socioeconomic History  . Marital status: Single    Spouse name: Not on file  . Number of children: Not on file  . Years of education: Not on file  . Highest education level: Not on file  Occupational History  . Not on file  Tobacco Use  . Smoking status: Former Smoker    Packs/day: 0.25    Years: 5.00    Pack years: 1.25    Types: E-cigarettes    Quit date: 01/06/2020  . Smokeless tobacco: Never Used  . Tobacco comment: vap e-cig  CBD very little  Vaping Use  . Vaping Use: Some days  . Start date: 12/18/2016  . Last attempt to quit: 01/06/2020  . Devices: e-cig  Substance and Sexual Activity  . Alcohol use: Yes    Alcohol/week: 1.0 standard drink    Types: 1 Glasses of wine per week    Comment: 1-2 times a month  . Drug use: Yes    Frequency: 1.0 times per week    Types: Marijuana    Comment: CBD once  .  Sexual activity: Not Currently  Other Topics Concern  . Not on file  Social History Narrative  . Not on file   Social Determinants of Health   Financial Resource Strain:   . Difficulty of Paying Living Expenses: Not on file  Food Insecurity:   . Worried About Programme researcher, broadcasting/film/video in the Last Year: Not on file  . Ran Out of Food in the Last Year: Not on file  Transportation Needs:   . Lack of Transportation (Medical): Not on file  . Lack of Transportation (Non-Medical): Not on file  Physical Activity:   . Days of Exercise per Week: Not on file  . Minutes of Exercise per Session: Not on  file  Stress:   . Feeling of Stress : Not on file  Social Connections:   . Frequency of Communication with Friends and Family: Not on file  . Frequency of Social Gatherings with Friends and Family: Not on file  . Attends Religious Services: Not on file  . Active Member of Clubs or Organizations: Not on file  . Attends Banker Meetings: Not on file  . Marital Status: Not on file   Additional Social History:    Pain Medications: see MAR Prescriptions: see MAR Over the Counter: see MAR History of alcohol / drug use?: Yes Longest period of sobriety (when/how long): unsure Negative Consequences of Use: Financial Withdrawal Symptoms: Other (Comment) (anxiety)                    Sleep: Fair  Appetite:  Fair  Current Medications: Current Facility-Administered Medications  Medication Dose Route Frequency Provider Last Rate Last Admin  . acetaminophen (TYLENOL) tablet 650 mg  650 mg Oral Q6H PRN Nira Conn A, NP      . alum & mag hydroxide-simeth (MAALOX/MYLANTA) 200-200-20 MG/5ML suspension 30 mL  30 mL Oral Q4H PRN Jackelyn Poling, NP      . bacitracin ointment   Topical BID Jackelyn Poling, NP   [phone] application at 01/08/20 1651  . feeding supplement (ENSURE ENLIVE / ENSURE PLUS) liquid 237 mL  237 mL Oral Q24H Estella Husk, MD   237 mL at 01/07/20 0900  . hydrOXYzine (ATARAX/VISTARIL) tablet 25 mg  25 mg Oral TID PRN Nira Conn A, NP      . magnesium hydroxide (MILK OF MAGNESIA) suspension 30 mL  30 mL Oral Daily PRN Jackelyn Poling, NP      . traZODone (DESYREL) tablet 50 mg  50 mg Oral QHS PRN Jackelyn Poling, NP        Lab Results:  Results for orders placed or performed during the hospital encounter of 01/06/20 (from the past 48 hour(s))  Hemoglobin A1c     Status: Abnormal   Collection Time: 01/07/20  6:21 AM  Result Value Ref Range   Hgb A1c MFr Bld 5.7 (H) 4.8 - 5.6 %    Comment: (NOTE)         Prediabetes: 5.7 - 6.4         Diabetes:  >6.4         Glycemic control for adults with diabetes: <7.0    Mean Plasma Glucose 117 mg/dL    Comment: (NOTE) Performed At: [facility] [address] [place], [place] [id number] Jolene Schimke MD VO:[id number]   Lipid panel     Status: None   Collection Time: 01/07/20  6:21 AM  Result Value Ref Range   Cholesterol 151  0 - 200 mg/dL   Triglycerides 37 <027 mg/dL   HDL 54 >74 mg/dL   Total CHOL/HDL Ratio 2.8 RATIO   VLDL 7 0 - 40 mg/dL   LDL Cholesterol 90 0 - 99 mg/dL    Comment:        Total Cholesterol/HDL:CHD Risk Coronary Heart Disease Risk Table                     Men   Women  1/2 Average Risk   3.4   3.3  Average Risk       5.0   4.4  2 X Average Risk   9.6   7.1  3 X Average Risk  23.4   11.0        Use the calculated Patient Ratio above and the CHD Risk Table to determine the patient's CHD Risk.        ATP III CLASSIFICATION (LDL):  <100     mg/dL   Optimal  128-786  mg/dL   Near or Above                    Optimal  130-159  mg/dL   Borderline  767-209  mg/dL   High  >470     mg/dL   Very High Performed at [facility], [address], [place], [place] [address]   TSH     Status: None   Collection Time: 01/07/20  6:21 AM  Result Value Ref Range   TSH 3.796 0.350 - 4.500 uIU/mL    Comment: Performed by a 3rd Generation assay with a functional sensitivity of <=0.01 uIU/mL. Performed at [facility], [address] [place], [place] [address]     Blood Alcohol level:  Lab Results  Component Value Date   ETH <10 01/03/2020    Metabolic Disorder Labs: Lab Results  Component Value Date   HGBA1C 5.7 (H) 01/07/2020   MPG 117 01/07/2020   No results found for: PROLACTIN Lab Results  Component Value Date   CHOL 151 01/07/2020   TRIG 37 01/07/2020   HDL 54 01/07/2020   CHOLHDL 2.8 01/07/2020   VLDL 7 01/07/2020   LDLCALC 90 01/07/2020    Physical Findings: AIMS: Facial and Oral Movements Muscles  of Facial Expression: None, normal Lips and Perioral Area: None, normal Jaw: None, normal Tongue: None, normal,Extremity Movements Upper (arms, wrists, hands, fingers): None, normal Lower (legs, knees, ankles, toes): None, normal, Trunk Movements Neck, shoulders, hips: None, normal, Overall Severity Severity of abnormal movements (highest score from questions above): None, normal Incapacitation due to abnormal movements: None, normal Patient's awareness of abnormal movements (rate only patient's report): No Awareness, Dental Status Current problems with teeth and/or dentures?: No Does patient usually wear dentures?: No  CIWA:  CIWA-Ar Total: 2 COWS:  COWS Total Score: 0  Musculoskeletal: Strength & Muscle Tone: within normal limits Gait & Station: normal Patient leans: N/A  Psychiatric Specialty Exam: Physical Exam Constitutional:      Appearance: Normal appearance.  HENT:     Head: Normocephalic and atraumatic.  Eyes:     Extraocular Movements: Extraocular movements intact.  Pulmonary:     Effort: Pulmonary effort is normal.  Musculoskeletal:     Cervical back: Normal range of motion.  Neurological:     Mental Status: He is alert.     Review of Systems  Blood pressure 118/84, pulse 66, temperature 98.5 F (36.9 C), temperature source Oral,  resp. rate 14, height 5\' 9"  (1.753 m), weight 59.9 kg, SpO2 100 %.Body mass index is 19.49 kg/m.  General Appearance: Casual and Fairly Groomed  Eye Contact:  Minimal  Speech:  Clear and Coherent, Normal Rate and increased latency of response at times  Volume:  Normal  Mood:  good  Affect:  Inappropriate and euthymic, laughing and smiling inappropriatly at times  Thought Process:  Coherent  Orientation:  Full (Time, Place, and Person)  Thought Content:  Logical  Suicidal Thoughts:  No  Homicidal Thoughts:  No  Memory:  Immediate;   Fair Recent;   Fair  Judgement:  Fair  Insight:  limited  Psychomotor Activity:  Normal   Concentration:  Concentration: Fair  Recall:  FiservFair  Fund of Knowledge:  Fair  Language:  Good  Akathisia:  No  Handed:  Right  AIMS (if indicated):     Assets:  Communication Skills Desire for Improvement Housing Intimacy Physical Health Resilience Social Support  ADL's:  Intact  Cognition:  WNL  Sleep:  Number of Hours: 3.5     Treatment Plan Summary: Daily contact with patient to assess and evaluate symptoms and progress in treatment   24 yo male with no past psychiatric hx presents to the ED after serious SA. He initially attempted to make a  noose with 2 belts, considered jumping off a bridge, but then cut his neck and wrist with a box cutter inside of [employer]. Pt makes contradictory statements about whether it was a SA and minimizes the seriousness of his actions. He declines starting medication at this time despite lengthy conversation.  Since he has been on the unit there has been concern that he has been displaying psychotic sx- making bizarre statements and seemingly RIS. Collateral obtained reveals that there is veracity to statements he has been making and no bizarre behavior has been noted from GF. Unclear if patient's bizarre affect is normal or if is indicative of psychosis. Will monitor and will treat with medications should psychotic sx become more apparent.   MDD Suicide attempt R/o psychotic disorder  -declines medication  Estella HuskKatherine S Tnya Ades, MD 01/08/2020, 5:29 PM

## 2020-01-08 NOTE — [facility] Suicide Risk Assessment (Signed)
[facility] INPATIENT:  Family/Significant Other Suicide Prevention Education  Suicide Prevention Education:  Patient Refusal for Family/Significant Other Suicide Prevention Education: The patient Terry Cochran has refused to provide written consent for family/significant other to be provided Family/Significant Other Suicide Prevention Education during admission and/or prior to discharge. Physician notified.  SPE completed with patient, as patient refused to consent to family contact. SPI pamphlet provided to pt and pt was encouraged to share information with support network, ask questions, and talk about any concerns relating to SPE. Patient denies access to guns/firearms and verbalized understanding of information provided. Mobile Crisis information also provided to patient.  Fredirick Lathe, LCSWA Clinicial Social Worker [employer]

## 2020-01-08 NOTE — [facility] Group Notes (Signed)
The focus of this group is to help patients establish daily goals to achieve during treatment and discuss how the patient can incorporate goal setting into their daily lives to aide in recovery.  Pt did not attend group 

## 2020-01-08 NOTE — [facility] Counselor (Signed)
During assessment pt was inappropriate in affect by laughing inappropriately. Pt made little to no eye contact during the assessment.   Terry Cochran, LCSWA Clinicial Social Worker [employer]

## 2020-01-09 MED ORDER — TRAZODONE HCL 50 MG PO TABS
50.0000 mg | ORAL_TABLET | Freq: Every day | ORAL | Status: DC
  Filled 2020-01-09: qty 1
  Filled 2020-01-09 (×2): qty 7
  Filled 2020-01-09: qty 1
  Filled 2020-01-09: qty 7

## 2020-01-09 NOTE — [facility] Group Notes (Signed)
[facility] LCSW Group Therapy  01/09/2020 2:03 PM  Type of Therapy:  Self-Care  Participation Level:  Did Not Attend  Summary of Progress/Problems: Pt was encouraged to attend group on discussion of self-care and to learn about the therapeutic benefits of self-care. Pt did not attend group.   Joelyn [place], LCSW 01/09/2020, 2:03 PM

## 2020-01-09 NOTE — Progress Notes (Signed)
Adult Psychoeducational Group Note  Date:  01/09/2020 Time:  10:41 PM  Group Topic/Focus:  Wrap-Up Group:   The focus of this group is to help patients review their daily goal of treatment and discuss progress on daily workbooks.  Participation Level:  Active  Participation Quality:  Appropriate  Affect:  Appropriate  Cognitive:  Appropriate  Insight: Appropriate  Engagement in Group:  Engaged  Modes of Intervention:  Discussion  Additional Comments:  Pt attend wrap up group. Hi day was a 7. The one positive thing that happen to him he talked on phone and he played a great game of basketball outside with sunshine.  Charna Busman Long 01/09/2020, 10:41 PM

## 2020-01-09 NOTE — Progress Notes (Signed)
Patient was cooperative with treatment. He spent most of the evening in the dayroom talking with peers. He was pleasant on approach. He denies SI & AVH. He was medication complaint and he is currently resting in bed quietly.  No issues to report on shift at this time.

## 2020-01-09 NOTE — Progress Notes (Signed)
Pt reports that he is not homicidal or suicidal and denied feeling depressed or anxious.  Pt described his mood as "solid".  Sutures to neck and arm are intact and no signs or symptoms of infection are observed. Pt has been interacting with peers in the dayroom and has been pleasant with staff.  No immediate concerns are identified at this time. Q 15 min safety checks remain in place.
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
## 2020-01-10 NOTE — [facility] Group Notes (Signed)
LCSW Group Therapy Note  01/10/2020   10:00-11:00am   Type of Therapy and Topic:  Group Therapy: Anger Cues and Responses  Participation Level:  Active   Description of Group:   In this group, patients learned how to recognize the physical, cognitive, emotional, and behavioral responses they have to anger-provoking situations.  They identified a recent time they became angry and how they reacted.  They analyzed how their reaction was possibly beneficial and how it was possibly unhelpful.  The group discussed a variety of healthier coping skills that could help with such a situation in the future.  Focus was placed on how helpful it is to recognize the underlying emotions to our anger, because working on those can lead to a more permanent solution as well as our ability to focus on the important rather than the urgent.  Therapeutic Goals: 1. Patients will remember their last incident of anger and how they felt emotionally and physically, what their thoughts were at the time, and how they behaved. 2. Patients will identify how their behavior at that time worked for them, as well as how it worked against them. 3. Patients will explore possible new behaviors to use in future anger situations. 4. Patients will learn that anger itself is normal and cannot be eliminated, and that healthier reactions can assist with resolving conflict rather than worsening situations.  Summary of Patient Progress:  The patient shared that his most recent time of anger was 2 weeks ago when he was trying to get home.  He stated that he had stopped first to get something for his girlfriend, but that had made him late to an important event.  Then he got a flat tire which delayed him more.  He was afraid his girlfriend would be suspicious of him being somewhere he should not, so he hurried but in that process did something wrong and had to start all over with changing the tire.  He was angry at himself and kicked the tire.  He  felt this was an unhealthy coping skills, but was able to see with the help of the group that perhaps that was not completely unhealthy and did not actually hurt anything.  He was encouraged to look at the reasons he could not just talk to his girlfriend about what had happened.  Therapeutic Modalities:   Cognitive Behavioral Therapy  Lynnell Chad

## 2020-01-10 NOTE — Progress Notes (Signed)
[facility] MD Progress Note  01/10/2020 1:25 PM Bralon Antkowiak  MRN:  [id number] Subjective:  Pt is seen and examined today. Pt states his mood is good. He rates his mood at 10/10 (10 is the best mood). Pt staes he slept well last night.  Pt states his appetite is better and improving. Pt rates his anxiety at 1/10 (10 is the worst anxiety). Pt has been refusing all oral medications. He states he refused trazodone last night. He states he doesn't have a difficulty in sleeping, but he doesn't get time to sleep as he was working 2 jobs. He denies any depression. Pt has refused to start any antidepressants. Pt states he needs therapy but he will find his own therapist. Currently,Pt denies any suicidal ideation, homicidal ideation and, visual and auditory hallucination. Pt denies any headache, nausea, vomiting, dizziness, chest pain, SOB, abdominal pain, diarrhea, and constipation. Pt denies any concerns Objective: Patient presented to the ER on 10/16 after self influcting lacerations to neck and L forearm. Labs reviewed, TSH wnl, etoh neg, UDS+ THC On Examination, Pt is smiling and laughing and making contradictory statements and have inappropriate affect. He is calm, pleasant, cooperative and oriented x 3. Pt's mood is euthymic and his affect is inappropriate. He is smiling and laughing talking about his situation. Denies SI , HI and AVH. Blood pressure 123/88, pulse 75, temperature (!) 96.7 F (35.9 C), resp. rate 18, height 5\' 9"  (1.753 m), weight 59.9 kg, SpO2 100 %.  Principal Problem: Severe major depression, single episode (HCC) Diagnosis: Principal Problem:   Severe major depression, single episode (HCC)  Total Time spent with patient: 20 minutes  Past Psychiatric History: see H&P  Past Medical History:  Past Medical History:  Diagnosis Date  . Anxiety   . Depression    History reviewed. No pertinent surgical history. Family History: History reviewed. No pertinent family history. Family  Psychiatric  History: see H&P Social History:  Social History   Substance and Sexual Activity  Alcohol Use Yes  . Alcohol/week: 1.0 standard drink  . Types: 1 Glasses of wine per week   Comment: 1-2 times a month     Social History   Substance and Sexual Activity  Drug Use Yes  . Frequency: 1.0 times per week  . Types: Marijuana   Comment: CBD once    Social History   Socioeconomic History  . Marital status: Single    Spouse name: Not on file  . Number of children: Not on file  . Years of education: Not on file  . Highest education level: Not on file  Occupational History  . Not on file  Tobacco Use  . Smoking status: Former Smoker    Packs/day: 0.25    Years: 5.00    Pack years: 1.25    Types: E-cigarettes    Quit date: 01/06/2020    Years since quitting: 0.0  . Smokeless tobacco: Never Used  . Tobacco comment: vap e-cig  CBD very little  Vaping Use  . Vaping Use: Some days  . Start date: 12/18/2016  . Last attempt to quit: 01/06/2020  . Devices: e-cig  Substance and Sexual Activity  . Alcohol use: Yes    Alcohol/week: 1.0 standard drink    Types: 1 Glasses of wine per week    Comment: 1-2 times a month  . Drug use: Yes    Frequency: 1.0 times per week    Types: Marijuana    Comment: CBD once  . Sexual  activity: Not Currently  Other Topics Concern  . Not on file  Social History Narrative  . Not on file   Social Determinants of Health   Financial Resource Strain:   . Difficulty of Paying Living Expenses: Not on file  Food Insecurity:   . Worried About Programme researcher, broadcasting/film/video in the Last Year: Not on file  . Ran Out of Food in the Last Year: Not on file  Transportation Needs:   . Lack of Transportation (Medical): Not on file  . Lack of Transportation (Non-Medical): Not on file  Physical Activity:   . Days of Exercise per Week: Not on file  . Minutes of Exercise per Session: Not on file  Stress:   . Feeling of Stress : Not on file  Social Connections:    . Frequency of Communication with Friends and Family: Not on file  . Frequency of Social Gatherings with Friends and Family: Not on file  . Attends Religious Services: Not on file  . Active Member of Clubs or Organizations: Not on file  . Attends Banker Meetings: Not on file  . Marital Status: Not on file   Additional Social History:    Pain Medications: see MAR Prescriptions: see MAR Over the Counter: see MAR History of alcohol / drug use?: Yes Longest period of sobriety (when/how long): unsure Negative Consequences of Use: Financial Withdrawal Symptoms: Other (Comment) (anxiety)                    Sleep: Fair  Appetite:  Good  Current Medications: Current Facility-Administered Medications  Medication Dose Route Frequency Provider Last Rate Last Admin  . acetaminophen (TYLENOL) tablet 650 mg  650 mg Oral Q6H PRN Nira Conn A, NP      . alum & mag hydroxide-simeth (MAALOX/MYLANTA) 200-200-20 MG/5ML suspension 30 mL  30 mL Oral Q4H PRN Jackelyn Poling, NP      . bacitracin ointment   Topical BID Jackelyn Poling, NP   [phone] application at 01/10/20 [id number]  . feeding supplement (ENSURE ENLIVE / ENSURE PLUS) liquid 237 mL  237 mL Oral Q24H Estella Husk, MD   237 mL at 01/10/20 9242  . hydrOXYzine (ATARAX/VISTARIL) tablet 25 mg  25 mg Oral TID PRN Nira Conn A, NP      . magnesium hydroxide (MILK OF MAGNESIA) suspension 30 mL  30 mL Oral Daily PRN Nira Conn A, NP      . traZODone (DESYREL) tablet 50 mg  50 mg Oral QHS Estella Husk, MD        Lab Results: No results found for this or any previous visit (from the past 48 hour(s)).  Blood Alcohol level:  Lab Results  Component Value Date   ETH <10 01/03/2020    Metabolic Disorder Labs: Lab Results  Component Value Date   HGBA1C 5.7 (H) 01/07/2020   MPG 117 01/07/2020   No results found for: PROLACTIN Lab Results  Component Value Date   CHOL 151 01/07/2020   TRIG 37 01/07/2020    HDL 54 01/07/2020   CHOLHDL 2.8 01/07/2020   VLDL 7 01/07/2020   LDLCALC 90 01/07/2020    Physical Findings: AIMS: Facial and Oral Movements Muscles of Facial Expression: None, normal Lips and Perioral Area: None, normal Jaw: None, normal Tongue: None, normal,Extremity Movements Upper (arms, wrists, hands, fingers): None, normal Lower (legs, knees, ankles, toes): None, normal, Trunk Movements Neck, shoulders, hips: None, normal, Overall Severity Severity of abnormal  movements (highest score from questions above): None, normal Incapacitation due to abnormal movements: None, normal Patient's awareness of abnormal movements (rate only patient's report): No Awareness, Dental Status Current problems with teeth and/or dentures?: No Does patient usually wear dentures?: No  CIWA:  CIWA-Ar Total: 2 COWS:  COWS Total Score: 0  Musculoskeletal: Strength & Muscle Tone: within normal limits Gait & Station: normal Patient leans: N/A  Psychiatric Specialty Exam: Physical Exam Vitals and nursing note reviewed.  Constitutional:      General: He is not in acute distress.    Appearance: Normal appearance. He is normal weight. He is not ill-appearing, toxic-appearing or diaphoretic.  HENT:     Head: Normocephalic and atraumatic.  Pulmonary:     Effort: Pulmonary effort is normal.  Neurological:     General: No focal deficit present.     Mental Status: He is alert and oriented to person, place, and time.     Review of Systems  Constitutional: Negative for activity change, appetite change, chills, diaphoresis, fatigue and fever.  Respiratory: Negative.   Cardiovascular: Negative.   Genitourinary: Negative.   Musculoskeletal: Negative.   Neurological: Negative.   Psychiatric/Behavioral: Negative for behavioral problems, decreased concentration, dysphoric mood, hallucinations and sleep disturbance. The patient is not nervous/anxious.     Blood pressure 123/88, pulse 75, temperature (!)  96.7 F (35.9 C), resp. rate 18, height 5\' 9"  (1.753 m), weight 59.9 kg, SpO2 100 %.Body mass index is 19.49 kg/m.  General Appearance: Casual  Eye Contact:  Minimal  Speech:  Clear and Coherent  Volume:  Normal  Mood:  Euthymic  Affect:  Inappropriate smiling and laughing inappropriately  Thought Process:  Coherent  Orientation:  Full (Time, Place, and Person)  Thought Content:  Logical  Suicidal Thoughts:  No  Homicidal Thoughts:  No  Memory:  Immediate;   Fair Recent;   Fair Remote;   Fair  Judgement:  Fair  Insight:  Shallow  Psychomotor Activity:  Normal  Concentration:  Concentration: Fair and Attention Span: Fair  Recall:  of Knowledge:  Fair  Language:  Good  Akathisia:  No  Handed:  Right  AIMS (if indicated):     Assets:  Communication Skills Desire for Improvement Housing Intimacy Physical Health Resilience Social Support  ADL's:  Intact  Cognition:  WNL  Sleep:  Number of Hours: 5     Treatment Plan Summary:Patient presented to the ER on 10/16 after self influcting lacerations to neck and L forearm. Labs reviewed, TSH wnl, etoh neg, UDS+ THC On Examination, Pt is smiling and laughing and making contradictory statements and have inappropriate affect. He is calm, pleasant, cooperative and oriented x 3. Pt's mood is euthymic and his affect is inappropriate. He is smiling and laughing talking about his situation. Denies SI , HI and AVH. Blood pressure 123/88, pulse 75, temperature (!) 96.7 F (35.9 C), resp. rate 18, height 5\' 9"  (1.753 m), weight 59.9 kg, SpO2 100 %. Pt has refused to start any antidepressants. Pt has refused trazodone. Plan-  Daily contact with patient to assess and evaluate symptoms and progress in treatment -Monitor Vitals. -Monitor for Suicidal Ideation. -Monitor for withdrawal symptoms. -Monitor for medication side effects. -Continue Milk of Magnesia 30 ml PRN Daily for Constipation. -Continue Maalox/Mylanta 30 ml Q4H PRN  for Indigestion. -Continue Bacitracin ointment BID -Continue Hydroxyzine 25 mg TID PRN for Anxiety. -Continue Trazodone 50 mg to QHS  for sleep (Pt refused) -Patient will participate in the therapeutic  group milieu. -Disposition in Progress.   Karsten RoVandana  Monzerrath Mcburney, MD 01/10/2020, 1:25 PM

## 2020-01-10 NOTE — Progress Notes (Signed)
D:  Patient's self inventory sheet, patient sleeps good, no sleep medication.  Good appetite, high energy level, good concentration.  Denied depression, hopeless and anxiety.  Denied withdrawals.  Denied SI.  Denied physical problems.  Denied physical pain.  Goal is "my 30 coping strategies.  Plans to practice 5 of then before the end of today.  Thank you for your hospitality."  Does have discharge plans. A:  Medications administered per MD orders.  Emotional support and encouragement given patient. R:  Denied SI and HI, contracts for safety.  Denied A/V hallucinations.  Safety maintained with 15 minute checks.

## 2020-01-10 NOTE — Progress Notes (Signed)
   01/10/20 0300  Psych Admission Type (Psych Patients Only)  Admission Status Involuntary  Psychosocial Assessment  Eye Contact Fair  Facial Expression Anxious  Affect Anxious  Speech Logical/coherent  Interaction Assertive  Motor Activity Other (Comment) (WDL)  Appearance/Hygiene In scrubs  Behavior Characteristics Anxious;Fidgety  Mood Pleasant  Thought Process  Coherency WDL  Content Blaming self  Delusions None reported or observed  Perception WDL  Hallucination None reported or observed  Judgment Poor  Confusion None  Danger to Self  Current suicidal ideation? Denies  Danger to Others  Danger to Others None reported or observed  Pt refused scheduled Trazodone stating " I do not take any medications" Denies SI/HI this shift support and encouragement provided as needed.

## 2020-01-10 NOTE — Progress Notes (Signed)
   01/10/20 2305  Psych Admission Type (Psych Patients Only)  Admission Status Involuntary  Psychosocial Assessment  Patient Complaints None  Eye Contact Brief  Facial Expression Flat  Affect Appropriate to circumstance  Speech Logical/coherent  Interaction Minimal  Motor Activity Other (Comment) (WDL)  Appearance/Hygiene In scrubs  Behavior Characteristics Appropriate to situation  Mood Pleasant  Thought Process  Coherency WDL  Content WDL  Delusions None reported or observed  Perception WDL  Hallucination None reported or observed  Judgment Poor  Confusion None  Danger to Self  Current suicidal ideation? Denies  Danger to Others  Danger to Others None reported or observed

## 2020-01-10 NOTE — Progress Notes (Signed)
[facility] Group Notes:  (Nursing/MHT/Case Management/Adjunct)  Date:  01/10/2020  Time:  2030  Type of Therapy:  wrap up group  Participation Level:  Active  Participation Quality:  Appropriate, Attentive, Sharing and Supportive  Affect:  Appropriate  Cognitive:  Appropriate  Insight:  Improving  Engagement in Group:  Engaged  Modes of Intervention:  Clarification, Education and Support  Summary of Progress/Problems: Positive thinking and self care were discussed.   Johann Capers S 01/10/2020, 9:08 PM

## 2020-01-10 NOTE — Progress Notes (Signed)
   01/10/20 2302  COVID-19 Daily Checkoff  Have you had a fever (temp > 37.80C/100F)  in the past 24 hours?  No  If you have had runny nose, nasal congestion, sneezing in the past 24 hours, has it worsened? No  COVID-19 EXPOSURE  Have you traveled outside the state in the past 14 days? No  Have you been in contact with someone with a confirmed diagnosis of COVID-19 or PUI in the past 14 days without wearing appropriate PPE? No  Have you been living in the same home as a person with confirmed diagnosis of COVID-19 or a PUI (household contact)? No  Have you been diagnosed with COVID-19? No

## 2020-01-10 NOTE — Plan of Care (Signed)
Nurse discussed anxiety, depression and coping skills with patient.  

## 2020-01-11 DIAGNOSIS — F322 Major depressive disorder, single episode, severe without psychotic features: Principal | ICD-10-CM

## 2020-01-11 MED ORDER — TRAZODONE HCL 50 MG PO TABS
50.0000 mg | ORAL_TABLET | Freq: Every day | ORAL | 0 refills | Status: AC
Start: 2020-01-11 — End: ?

## 2020-01-11 NOTE — Progress Notes (Signed)
°  [facility] Adult Case Management Discharge Plan :  Will you be returning to the same living situation after discharge:  Yes,  with girlfriend At discharge, do you have transportation home?: Yes,  will call girlfriend's co-worker Do you have the ability to pay for your medications: No.  Release of information consent forms completed and emailed to Medical Records, then turned in to Medical Records by CSW.  Patient to Follow up at:  Follow-up Information    [facility] Follow up.   Specialty: Behavioral Health Why: If you choose to receive therapy or medication management services, please go to this provider as a walk-in patient. Contact information: [address] [place] [address] [phone]              Next level of care provider has access to [facility] Link:yes  Safety Planning and Suicide Prevention discussed: No.   Reviewed with patient alone, as he declined collateral contact  Have you used any form of tobacco in the last 30 days? (Cigarettes, Smokeless Tobacco, Cigars, and/or Pipes): Yes  Has patient been referred to the Quitline?: Patient refused referral  Patient has been referred for addiction treatment: N/A  Lynnell Chad, LCSW 01/11/2020, 9:15 AM

## 2020-01-11 NOTE — [facility] Suicide Risk Assessment (Signed)
[facility] Discharge Suicide Risk Assessment   Principal Problem: Severe major depression, single episode (HCC) Discharge Diagnoses: Principal Problem:   Severe major depression, single episode (HCC)   Total Time spent with patient: 20 minutes  Musculoskeletal: Strength & Muscle Tone: within normal limits Gait & Station: normal Patient leans: N/A  Psychiatric Specialty Exam: Review of Systems  All other systems reviewed and are negative.   Blood pressure 127/90, pulse 67, temperature 98.1 F (36.7 C), temperature source Oral, resp. rate 16, height 5\' 9"  (1.753 m), weight 59.9 kg, SpO2 100 %.Body mass index is 19.49 kg/m.  General Appearance: Casual  Eye Contact::  Good  Speech:  Normal Rate409  Volume:  Normal  Mood:  Euthymic  Affect:  Congruent  Thought Process:  Coherent and Descriptions of Associations: Intact  Orientation:  Full (Time, Place, and Person)  Thought Content:  Logical  Suicidal Thoughts:  No  Homicidal Thoughts:  No  Memory:  Immediate;   Fair Recent;   Fair Remote;   Fair  Judgement:  Intact  Insight:  Fair  Psychomotor Activity:  Normal  Concentration:  Good  Recall:  Good  Fund of Knowledge:Good  Language: Good  Akathisia:  Negative  Handed:  Right  AIMS (if indicated):     Assets:  Communication Skills Desire for Improvement Housing Resilience Social Support  Sleep:  Number of Hours: 6.25  Cognition: WNL  ADL's:  Intact   Mental Status Per Nursing Assessment::   On Admission:  Self-harm thoughts, Self-harm behaviors, Suicidal ideation indicated by patient  Demographic Factors:  Male  Loss Factors: NA  Historical Factors: Impulsivity  Risk Reduction Factors:   Living with another person, especially a relative and Positive social support  Continued Clinical Symptoms:  Depression:   Impulsivity  Cognitive Features That Contribute To Risk:  None    Suicide Risk:  Minimal: No identifiable suicidal ideation.  Patients presenting  with no risk factors but with morbid ruminations; may be classified as minimal risk based on the severity of the depressive symptoms   Follow-up Information    [facility] Follow up.   Specialty: Behavioral Health Why: If you choose to receive therapy or medication management services, please go to this provider as a walk-in patient. Contact information: [address] [place] [phone]              Plan Of Care/Follow-up recommendations:  Activity:  ad lib  [phone], MD 01/11/2020, 9:35 AM

## 2020-01-11 NOTE — [facility] Group Notes (Signed)
[facility] LCSW Group Therapy Note  Date/Time:  01/11/2020 9:00-10:00 or 10:00-11:00AM  Type of Therapy and Topic:  Group Therapy:  Healthy and Unhealthy Supports  Participation Level:  Minimal   Description of Group:  Patients in this group were introduced to the idea of adding a variety of healthy supports to address the various needs in their lives.Patients discussed what additional healthy supports could be helpful in their recovery and wellness after discharge in order to prevent future hospitalizations.   An emphasis was placed on using counselor, doctor, therapy groups, 12-step groups, and problem-specific support groups to expand supports.  Several songs were played to emphasize points made throughout group.  Therapeutic Goals:   1)  discuss importance of adding supports to stay well once out of the hospital  2)  compare healthy versus unhealthy supports and identify some examples of each  3)  generate ideas and descriptions of healthy supports that can be added  4)  offer mutual support about how to address unhealthy supports  5)  encourage active participation in and adherence to discharge plan    Summary of Patient Progress:  The patient stated that current healthy supports in his life are co-workers, his girlfriend, himself, his dog and his video gaming friends while current unhealthy supports include toxic family members and friends without ambition or goals in life.  The patient expressed a willingness to add working on maintaining himself as a healthy support(s) to help in his recovery journey.   Therapeutic Modalities:   Motivational Interviewing Brief Solution-Focused Therapy  Ambrose Mantle, LCSW

## 2020-01-11 NOTE — Progress Notes (Signed)
Discharge Note:  Patient discharged home.  Patient was asked to wait and let the SW call for a ride.  But patient called his girlfriend who called [employer] to come pick up patient.  Patient was discharged with all his belongings, etc and his ride was not there.  Patient was asked to wait and talk to SW who would help him with ride, but he left [facility] grounds.  Suicide prevention information was given to patient who stated he understood and had no questions.  Patient stated he received all his belongings, clothing, toiletries, misc items, etc.  Patient stated he appreciated all assistance received from [facility] staff.
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
## 2020-01-11 NOTE — Plan of Care (Signed)
Nurse discussed anxiety, depression and coping skills with patient.  

## 2020-01-11 NOTE — Discharge Summary (Signed)
Physician Discharge Summary Note  Patient:  Terry Cochran is an 24 y.o., male MRN:  [id number] DOB:  Oct 25, 1995 Patient phone:  [phone] (home)  Patient address:   [address] [place] [address],  Total Time spent with patient: 15 minutes  Date of Admission:  01/06/2020 Date of Discharge:  01/11/2020  Reason for Admission:  Per admission assessment note:Dorrell Travarus Mcdayis an 24 y.o.singlemalewho presents unaccompanied to [facility] ED via EMS after being petitioned for involuntary commitment by a friend, [name] [phone].Affidavit and petition states: "Respondent was found in the woods about a month ago about to slit his wrists but girlfriend was able to stop him. Girlfriend also stated to friend that he had gone back to the woods to possibly make another attempt. Respondent left a troubling message on phone. Police were called to check on him and found him sitting beside two belt that he had tied together."    Principal Problem: Severe major depression, single episode [facility]) Discharge Diagnoses: Principal Problem:   Severe major depression, single episode [facility])   Past Psychiatric History:   Past Medical History:  Past Medical History:  Diagnosis Date  . Anxiety   . Depression    History reviewed. No pertinent surgical history. Family History: History reviewed. No pertinent family history. Family Psychiatric  History:  Social History:  Social History   Substance and Sexual Activity  Alcohol Use Yes  . Alcohol/week: 1.0 standard drink  . Types: 1 Glasses of wine per week   Comment: 1-2 times a month     Social History   Substance and Sexual Activity  Drug Use Yes  . Frequency: 1.0 times per week  . Types: Marijuana   Comment: CBD once    Social History   Socioeconomic History  . Marital status: Single    Spouse name: Not on file  . Number of children: Not on file  . Years of education: Not on file  . Highest education  level: Not on file  Occupational History  . Not on file  Tobacco Use  . Smoking status: Former Smoker    Packs/day: 0.25    Years: 5.00    Pack years: 1.25    Types: E-cigarettes    Quit date: 01/06/2020    Years since quitting: 0.0  . Smokeless tobacco: Never Used  . Tobacco comment: vap e-cig  CBD very little  Vaping Use  . Vaping Use: Some days  . Start date: 12/18/2016  . Last attempt to quit: 01/06/2020  . Devices: e-cig  Substance and Sexual Activity  . Alcohol use: Yes    Alcohol/week: 1.0 standard drink    Types: 1 Glasses of wine per week    Comment: 1-2 times a month  . Drug use: Yes    Frequency: 1.0 times per week    Types: Marijuana    Comment: CBD once  . Sexual activity: Not Currently  Other Topics Concern  . Not on file  Social History Narrative  . Not on file   Social Determinants of Health   Financial Resource Strain:   . Difficulty of Paying Living Expenses: Not on file  Food Insecurity:   . Worried About Programme researcher, broadcasting/film/video in the Last Year: Not on file  . Ran Out of Food in the Last Year: Not on file  Transportation Needs:   . Lack of Transportation (Medical): Not on file  . Lack of Transportation (Non-Medical): Not on file  Physical  Activity:   . Days of Exercise per Week: Not on file  . Minutes of Exercise per Session: Not on file  Stress:   . Feeling of Stress : Not on file  Social Connections:   . Frequency of Communication with Friends and Family: Not on file  . Frequency of Social Gatherings with Friends and Family: Not on file  . Attends Religious Services: Not on file  . Active Member of Clubs or Organizations: Not on file  . Attends Banker Meetings: Not on file  . Marital Status: Not on file    Hospital Course:  Dequarius Jeffries was admitted for Severe major depression, single episode (HCC)  and crisis management.  Pt was treated discharged with the medications listed below under Medication List.  Medical problems  were identified and treated as needed.  Home medications were restarted as appropriate.  Improvement was monitored by observation and Yolande Jolly 's daily report of symptom reduction.  Emotional and mental status was monitored by daily self-inventory reports completed by Yolande Jolly and clinical staff.         Eliga Travarus Mauss was evaluated by the treatment team for stability and plans for continued recovery upon discharge. Refoel Travarus Weinreb 's motivation was an integral factor for scheduling further treatment. Employment, transportation, bed availability, health status, family support, and any pending legal issues were also considered during hospital stay. Pt was offered further treatment options upon discharge including but not limited to Residential, Intensive Outpatient, and Outpatient treatment.  Demon Travarus Bob will follow up with the services as listed below under Follow Up Information.     Upon completion of this admission the patient was both mentally and medically stable for discharge denying suicidal/homicidal ideation, auditory/visual/tactile hallucinations.  Martie Travarus Caesar responded well to treatment with Trazodone 50 mg without adverse effects. Pt demonstrated improvement without reported or observed adverse effects to the point of stability appropriate for outpatient management. Pertinent labs include:  Hgb A1C 5.7 (high), for which outpatient follow-up is necessary for lab recheck as mentioned below. Reviewed CBC, CMP, BAL, and UDS; all unremarkable aside from noted exceptions.   Physical Findings: AIMS: Facial and Oral Movements Muscles of Facial Expression: None, normal Lips and Perioral Area: None, normal Jaw: None, normal Tongue: None, normal,Extremity Movements Upper (arms, wrists, hands, fingers): None, normal Lower (legs, knees, ankles, toes): None, normal, Trunk Movements Neck, shoulders, hips: None, normal, Overall Severity Severity of  abnormal movements (highest score from questions above): None, normal Incapacitation due to abnormal movements: None, normal Patient's awareness of abnormal movements (rate only patient's report): No Awareness, Dental Status Current problems with teeth and/or dentures?: No Does patient usually wear dentures?: No  CIWA:  CIWA-Ar Total: 2 COWS:  COWS Total Score: 0  Musculoskeletal: Strength & Muscle Tone: within normal limits Gait & Station: normal Patient leans: N/A  Psychiatric Specialty Exam: See SRA by MD Physical Exam Vitals reviewed.  Neurological:     Mental Status: He is alert.  Psychiatric:        Attention and Perception: Perception normal.        Mood and Affect: Affect normal.        Cognition and Memory: Cognition normal.        Judgment: Judgment normal.     Review of Systems  Psychiatric/Behavioral: Negative for suicidal ideas. The patient is nervous/anxious (improved ).   All other systems reviewed and are negative.   Blood pressure 127/90, pulse 67, temperature  98.1 F (36.7 C), temperature source Oral, resp. rate 16, height 5\' 9"  (1.753 m), weight 59.9 kg, SpO2 100 %.Body mass index is 19.49 kg/m.    Have you used any form of tobacco in the last 30 days? (Cigarettes, Smokeless Tobacco, Cigars, and/or Pipes): Yes  Has this patient used any form of tobacco in the last 30 days? (Cigarettes, Smokeless Tobacco, Cigars, and/or Pipes) Yes, Yes, A prescription for an FDA-approved tobacco cessation medication was offered at discharge and the patient refused  Blood Alcohol level:  Lab Results  Component Value Date   ETH <10 01/03/2020    Metabolic Disorder Labs:  Lab Results  Component Value Date   HGBA1C 5.7 (H) 01/07/2020   MPG 117 01/07/2020   No results found for: PROLACTIN Lab Results  Component Value Date   CHOL 151 01/07/2020   TRIG 37 01/07/2020   HDL 54 01/07/2020   CHOLHDL 2.8 01/07/2020   VLDL 7 01/07/2020   LDLCALC 90 01/07/2020    See  Psychiatric Specialty Exam and Suicide Risk Assessment completed by Attending Physician prior to discharge.  Discharge destination:  Home  Is patient on multiple antipsychotic therapies at discharge:  No   Has Patient had three or more failed trials of antipsychotic monotherapy by history:  No  Recommended Plan for Multiple Antipsychotic Therapies: NA  Discharge Instructions    Diet - low sodium heart healthy   Complete by: As directed    Discharge instructions   Complete by: As directed    Take all medications as prescribed. Keep all follow-up appointments as scheduled.  Do not consume alcohol or use illegal drugs while on prescription medications. Report any adverse effects from your medications to your primary care provider promptly.  In the event of recurrent symptoms or worsening symptoms, call 911, a crisis hotline, or go to the nearest emergency department for evaluation.   Increase activity slowly   Complete by: As directed      Allergies as of 01/11/2020      Reactions   Nickel Dermatitis, Rash      Medication List    TAKE these medications     Indication  traZODone 50 MG tablet Commonly known as: DESYREL Take 1 tablet (50 mg total) by mouth at bedtime.  Indication: Trouble Sleeping       Follow-up Information    [facility] Follow up.   Specialty: Behavioral Health Why: If you choose to receive therapy or medication management services, please go to this provider as a walk in patient. Contact information: [address] [place] [phone]              Follow-up recommendations:  Activity:  as tolerated Diet:  heart healthy   Comments:  Take all medications as prescribed. Keep all follow-up appointments as scheduled.  Do not consume alcohol or use illegal drugs while on prescription medications. Report any adverse effects from your medications to your primary care provider promptly.  In the  event of recurrent symptoms or worsening symptoms, call 911, a crisis hotline, or go to the nearest emergency department for evaluation.   Signed: [phone], NP 01/11/2020, 8:57 AM

## 2020-01-13 NOTE — Progress Notes (Signed)
Counseling Intern (CI) checked in on pt after being referred by SW. PT seemed pleasant and aware and consented to record.  CI asked  Pt how he was feeling mood wise and the pt was feeling better  Pt talked about the family experiences and how it all seemed too much and that's why he hurt himself. CI helped him name some feelings.  CI told pt that they can talk anytime   Luvenia Heller counseling intern under [facility]
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
